# Patient Record
Sex: Male | Born: 1975 | Race: Black or African American | Hispanic: No | Marital: Single | Smoking: Current every day smoker
Health system: Southern US, Community
[De-identification: ages and names within clinical notes are randomized; demographics above are authoritative.]

## PROBLEM LIST (undated history)

## (undated) DIAGNOSIS — I10 Essential (primary) hypertension: Secondary | ICD-10-CM

---

## 2000-07-28 ENCOUNTER — Emergency Department (HOSPITAL_COMMUNITY): Admission: EM | Admit: 2000-07-28 | Discharge: 2000-07-28 | Payer: Self-pay | Admitting: Internal Medicine

## 2000-08-15 ENCOUNTER — Emergency Department (HOSPITAL_COMMUNITY): Admission: EM | Admit: 2000-08-15 | Discharge: 2000-08-15 | Payer: Self-pay | Admitting: Emergency Medicine

## 2001-04-25 ENCOUNTER — Emergency Department (HOSPITAL_COMMUNITY): Admission: EM | Admit: 2001-04-25 | Discharge: 2001-04-25 | Payer: Self-pay | Admitting: Podiatry

## 2002-09-24 ENCOUNTER — Emergency Department (HOSPITAL_COMMUNITY): Admission: EM | Admit: 2002-09-24 | Discharge: 2002-09-24 | Payer: Self-pay | Admitting: Emergency Medicine

## 2002-09-24 ENCOUNTER — Encounter: Payer: Self-pay | Admitting: Emergency Medicine

## 2003-05-26 ENCOUNTER — Emergency Department (HOSPITAL_COMMUNITY): Admission: EM | Admit: 2003-05-26 | Discharge: 2003-05-26 | Payer: Self-pay | Admitting: Emergency Medicine

## 2003-06-21 ENCOUNTER — Emergency Department (HOSPITAL_COMMUNITY): Admission: EM | Admit: 2003-06-21 | Discharge: 2003-06-21 | Payer: Self-pay | Admitting: Emergency Medicine

## 2003-12-29 ENCOUNTER — Emergency Department (HOSPITAL_COMMUNITY): Admission: EM | Admit: 2003-12-29 | Discharge: 2003-12-29 | Payer: Self-pay | Admitting: Emergency Medicine

## 2005-07-10 ENCOUNTER — Emergency Department (HOSPITAL_COMMUNITY): Admission: EM | Admit: 2005-07-10 | Discharge: 2005-07-10 | Payer: Self-pay | Admitting: Family Medicine

## 2005-11-19 ENCOUNTER — Emergency Department (HOSPITAL_COMMUNITY): Admission: EM | Admit: 2005-11-19 | Discharge: 2005-11-19 | Payer: Self-pay | Admitting: Family Medicine

## 2005-12-04 ENCOUNTER — Ambulatory Visit (HOSPITAL_BASED_OUTPATIENT_CLINIC_OR_DEPARTMENT_OTHER): Admission: RE | Admit: 2005-12-04 | Discharge: 2005-12-04 | Payer: Self-pay | Admitting: Ophthalmology

## 2006-04-10 ENCOUNTER — Emergency Department (HOSPITAL_COMMUNITY): Admission: EM | Admit: 2006-04-10 | Discharge: 2006-04-10 | Payer: Self-pay | Admitting: Emergency Medicine

## 2008-02-02 ENCOUNTER — Emergency Department (HOSPITAL_COMMUNITY): Admission: EM | Admit: 2008-02-02 | Discharge: 2008-02-02 | Payer: Self-pay | Admitting: Emergency Medicine

## 2009-06-25 ENCOUNTER — Emergency Department (HOSPITAL_COMMUNITY): Admission: EM | Admit: 2009-06-25 | Discharge: 2009-06-25 | Payer: Self-pay | Admitting: Emergency Medicine

## 2009-12-10 ENCOUNTER — Emergency Department (HOSPITAL_COMMUNITY): Admission: EM | Admit: 2009-12-10 | Discharge: 2009-12-10 | Payer: Self-pay | Admitting: Emergency Medicine

## 2010-04-08 ENCOUNTER — Emergency Department (HOSPITAL_COMMUNITY): Admission: EM | Admit: 2010-04-08 | Discharge: 2010-04-08 | Payer: Self-pay | Admitting: Family Medicine

## 2011-02-01 LAB — POCT URINALYSIS DIP (DEVICE)
Glucose, UA: NEGATIVE mg/dL
Hgb urine dipstick: NEGATIVE
Ketones, ur: NEGATIVE mg/dL
Nitrite: NEGATIVE
Protein, ur: 30 mg/dL — AB
Specific Gravity, Urine: 1.025 (ref 1.005–1.030)
Urobilinogen, UA: 1 mg/dL (ref 0.0–1.0)
pH: 6 (ref 5.0–8.0)

## 2011-02-02 LAB — POCT URINALYSIS DIP (DEVICE)
Bilirubin Urine: NEGATIVE
Glucose, UA: 100 mg/dL — AB
Hgb urine dipstick: NEGATIVE
Ketones, ur: NEGATIVE mg/dL
Nitrite: NEGATIVE
Protein, ur: NEGATIVE mg/dL
Specific Gravity, Urine: >=1.03 (ref 1.005–1.030)
Urobilinogen, UA: 0.2 mg/dL (ref 0.0–1.0)
pH: 6 (ref 5.0–8.0)

## 2011-04-03 NOTE — Op Note (Signed)
NAME:  Riley Gates, Riley Gates NO.:  [id number]   MEDICAL RECORD NO.:  [id number]          PATIENT TYPE:  AMB   LOCATION:  DSC                          FACILITY:  [facility]   PHYSICIAN:  Salley Scarlet., M.D.DATE OF BIRTH:  10-26-1976   DATE OF PROCEDURE:  12/04/2005  DATE OF DISCHARGE:                                 OPERATIVE REPORT   PREOPERATIVE DIAGNOSIS:  Chalazion, lower lid, left eye.   POSTOPERATIVE DIAGNOSIS:  Chalazion, lower lid, left eye.   OPERATION:  Chalazion excision.   ANESTHESIA:  Local using Xylocaine 1%.   PROCEDURE:  The lower lid was inspected and there was a moderate size lesion  located along the medial 1/3 of the lower lid of the left eye.  The patient  was prepped and draped in the usual manner.  Then, several mL of Xylocaine  were injected into the lower lid in the region of the lesion.  The chalazion  clamp was applied over the lesion and the lid was everted.  A cruciate  incision was made in the tarsus of the lesion and the lesion was curetted  using the chalazion curet.  The sac was incised in total using sharp and  blunt dissection.  Polysporin ophthalmic ointment and a pressure patch was  applied.  The patient tolerated the procedure well and was discharged to the  post anesthesia care unit in satisfactory condition.  He is instructed to  rest today, to take Tylenol every 4 hours as needed for pain, to remove the  patch tomorrow, and to resume Vigamox drops four times a day.  He is to see  me in the office in one week.   DISCHARGE DIAGNOSIS:  Chalazion lower lid, left eye.      Salley Scarlet., M.D.  Electronically Signed     TB/MEDQ  D:  12/04/2005  T:  12/04/2005  Job:  [id number]

## 2011-04-20 ENCOUNTER — Inpatient Hospital Stay (INDEPENDENT_AMBULATORY_CARE_PROVIDER_SITE_OTHER)
Admission: RE | Admit: 2011-04-20 | Discharge: 2011-04-20 | Disposition: A | Discharge status: Home / Self Care | Payer: 59 | Source: Ambulatory Visit | Attending: Emergency Medicine | Admitting: Emergency Medicine

## 2011-04-20 DIAGNOSIS — L738 Other specified follicular disorders: Secondary | ICD-10-CM

## 2011-04-23 LAB — CULTURE, ROUTINE-ABSCESS

## 2011-08-10 LAB — OCCULT BLOOD X 1 CARD TO LAB, STOOL: Fecal Occult Bld: POSITIVE

## 2014-03-11 ENCOUNTER — Emergency Department (HOSPITAL_BASED_OUTPATIENT_CLINIC_OR_DEPARTMENT_OTHER): Payer: 59

## 2014-03-11 ENCOUNTER — Emergency Department (HOSPITAL_BASED_OUTPATIENT_CLINIC_OR_DEPARTMENT_OTHER)
Admission: EM | Admit: 2014-03-11 | Discharge: 2014-03-11 | Disposition: A | Discharge status: Home / Self Care | Payer: 59 | Attending: Emergency Medicine | Admitting: Emergency Medicine

## 2014-03-11 ENCOUNTER — Encounter (HOSPITAL_BASED_OUTPATIENT_CLINIC_OR_DEPARTMENT_OTHER): Payer: Self-pay | Admitting: Emergency Medicine

## 2014-03-11 DIAGNOSIS — R0602 Shortness of breath: Secondary | ICD-10-CM | POA: Insufficient documentation

## 2014-03-11 DIAGNOSIS — R55 Syncope and collapse: Secondary | ICD-10-CM | POA: Insufficient documentation

## 2014-03-11 DIAGNOSIS — R42 Dizziness and giddiness: Secondary | ICD-10-CM | POA: Insufficient documentation

## 2014-03-11 DIAGNOSIS — R0789 Other chest pain: Secondary | ICD-10-CM | POA: Insufficient documentation

## 2014-03-11 DIAGNOSIS — F17200 Nicotine dependence, unspecified, uncomplicated: Secondary | ICD-10-CM | POA: Insufficient documentation

## 2014-03-11 DIAGNOSIS — I10 Essential (primary) hypertension: Secondary | ICD-10-CM | POA: Insufficient documentation

## 2014-03-11 DIAGNOSIS — R61 Generalized hyperhidrosis: Secondary | ICD-10-CM | POA: Insufficient documentation

## 2014-03-11 HISTORY — DX: Essential (primary) hypertension: I10

## 2014-03-11 LAB — CBC
HEMATOCRIT: 51 % (ref 39.0–52.0)
HEMOGLOBIN: 16.6 g/dL (ref 13.0–17.0)
MCH: 27.4 pg (ref 26.0–34.0)
MCHC: 32.5 g/dL (ref 30.0–36.0)
MCV: 84.3 fL (ref 78.0–100.0)
Platelets: 320 10*3/uL (ref 150–400)
RBC: 6.05 MIL/uL — AB (ref 4.22–5.81)
RDW: 14.9 % (ref 11.5–15.5)
WBC: 6.8 10*3/uL (ref 4.0–10.5)

## 2014-03-11 LAB — BASIC METABOLIC PANEL
BUN: 9 mg/dL (ref 6–23)
CHLORIDE: 103 meq/L (ref 96–112)
CO2: 27 meq/L (ref 19–32)
CREATININE: 1 mg/dL (ref 0.50–1.35)
Calcium: 9.1 mg/dL (ref 8.4–10.5)
GFR calc Af Amer: >90 mL/min (ref >90)
GFR calc non Af Amer: >90 mL/min (ref >90)
GLUCOSE: 125 mg/dL — AB (ref 70–99)
Potassium: 4.1 mEq/L (ref 3.7–5.3)
Sodium: 142 mEq/L (ref 137–147)

## 2014-03-11 LAB — TROPONIN I: Troponin I: <0.3 ng/mL (ref <0.30)

## 2014-03-11 MED ORDER — HYDROCHLOROTHIAZIDE 25 MG PO TABS: 12.5000 mg | ORAL_TABLET | Freq: Every day | ORAL | Status: DC

## 2014-03-11 MED ORDER — SODIUM CHLORIDE 0.9 % IV BOLUS (SEPSIS)
1000.0000 mL | Freq: Once | INTRAVENOUS | Status: AC
  Administered 2014-03-11: 1000 mL via INTRAVENOUS

## 2014-03-11 NOTE — ED Notes (Addendum)
IV D/C cath in tact, pt tolerated well

## 2014-03-11 NOTE — ED Notes (Addendum)
Patient reports that he has been diagnosed in the past with HTN and never followed up by taking any medication. Yesterday became diaphoretic and near syncope with headache and assumed his BP was high and took his mother's BP pill; norvasc 10mg . Today those symptoms have resolved but wants to get checked out. No chest pain. Patient also reports that he uses cocaine and marijuana 4-5 times per week, last usage Friday night.

## 2014-03-11 NOTE — ED Notes (Addendum)
Pt states has felt "stressed" for past few years and that he has tried self-medicating with drugs.  Last used cocaine on Friday, only smokes cigarettes when using cocaine.  Last alcohol consumed Saturday morning.  Pt c/o nausea and dizziness with blurry vision off and on for past few months.  Has never taken BP medications regularly.   Pt tearful during assessment.  Denies any thoughts of hurting himself or others.

## 2014-03-11 NOTE — Discharge Instructions (Signed)
Arterial Hypertension °Arterial hypertension (high blood pressure) is a condition of elevated pressure in your blood vessels. Hypertension over a long period of time is a risk factor for strokes, heart attacks, and heart failure. It is also the leading cause of kidney (renal) failure.  °CAUSES  °· In Adults -- Over 90% of all hypertension has no known cause. This is called essential or primary hypertension. In the other 10% of people with hypertension, the increase in blood pressure is caused by another disorder. This is called secondary hypertension. Important causes of secondary hypertension are: °· Heavy alcohol use. °· Obstructive sleep apnea. °· Hyperaldosterosim (Conn's syndrome). °· Steroid use. °· Chronic kidney failure. °· Hyperparathyroidism. °· Medications. °· Renal artery stenosis. °· Pheochromocytoma. °· Cushing's disease. °· Coarctation of the aorta. °· Scleroderma renal crisis. °· Licorice (in excessive amounts). °· Drugs (cocaine, methamphetamine). °Your caregiver can explain any items above that apply to you. °· In Children -- Secondary hypertension is more common and should always be considered. °· Pregnancy -- Few women of childbearing age have high blood pressure. However, up to 10% of them develop hypertension of pregnancy. Generally, this will not harm the woman. It may be a sign of 3 complications of pregnancy: preeclampsia, HELLP syndrome, and eclampsia. Follow up and control with medication is necessary. °SYMPTOMS  °· This condition normally does not produce any noticeable symptoms. It is usually found during a routine exam. °· Malignant hypertension is a late problem of high blood pressure. It may have the following symptoms: °· Headaches. °· Blurred vision. °· End-organ damage (this means your kidneys, heart, lungs, and other organs are being damaged). °· Stressful situations can increase the blood pressure. If a person with normal blood pressure has their blood pressure go up while being  seen by their caregiver, this is often termed "white coat hypertension." Its importance is not known. It may be related with eventually developing hypertension or complications of hypertension. °· Hypertension is often confused with mental tension, stress, and anxiety. °DIAGNOSIS  °The diagnosis is made by 3 separate blood pressure measurements. They are taken at least 1 week apart from each other. If there is organ damage from hypertension, the diagnosis may be made without repeat measurements. °Hypertension is usually identified by having blood pressure readings: °· Above 140/90 mmHg measured in both arms, at 3 separate times, over a couple weeks. °· Over 130/80 mmHg should be considered a risk factor and may require treatment in patients with diabetes. °Blood pressure readings over 120/80 mmHg are called "pre-hypertension" even in non-diabetic patients. °To get a true blood pressure measurement, use the following guidelines. Be aware of the factors that can alter blood pressure readings. °· Take measurements at least 1 hour after caffeine. °· Take measurements 30 minutes after smoking and without any stress. This is another reason to quit smoking  it raises your blood pressure. °· Use a proper cuff size. Ask your caregiver if you are not sure about your cuff size. °· Most home blood pressure cuffs are automatic. They will measure systolic and diastolic pressures. The systolic pressure is the pressure reading at the start of sounds. Diastolic pressure is the pressure at which the sounds disappear. If you are elderly, measure pressures in multiple postures. Try sitting, lying or standing. °· Sit at rest for a minimum of 5 minutes before taking measurements. °· You should not be on any medications like decongestants. These are found in many cold medications. °· Record your blood pressure readings and review   them with your caregiver. °If you have hypertension: °· Your caregiver may do tests to be sure you do not have  secondary hypertension (see "causes" above). °· Your caregiver may also look for signs of metabolic syndrome. This is also called Syndrome X or Insulin Resistance Syndrome. You may have this syndrome if you have type 2 diabetes, abdominal obesity, and abnormal blood lipids in addition to hypertension. °· Your caregiver will take your medical and family history and perform a physical exam. °· Diagnostic tests may include blood tests (for glucose, cholesterol, potassium, and kidney function), a urinalysis, or an EKG. Other tests may also be necessary depending on your condition. °PREVENTION  °There are important lifestyle issues that you can adopt to reduce your chance of developing hypertension: °· Maintain a normal weight. °· Limit the amount of salt (sodium) in your diet. °· Exercise often. °· Limit alcohol intake. °· Get enough potassium in your diet. Discuss specific advice with your caregiver. °· Follow a DASH diet (dietary approaches to stop hypertension). This diet is rich in fruits, vegetables, and low-fat dairy products, and avoids certain fats. °PROGNOSIS  °Essential hypertension cannot be cured. Lifestyle changes and medical treatment can lower blood pressure and reduce complications. The prognosis of secondary hypertension depends on the underlying cause. Many people whose hypertension is controlled with medicine or lifestyle changes can live a normal, healthy life.  °RISKS AND COMPLICATIONS  °While high blood pressure alone is not an illness, it often requires treatment due to its short- and long-term effects on many organs. Hypertension increases your risk for: °· CVAs or strokes (cerebrovascular accident). °· Heart failure due to chronically high blood pressure (hypertensive cardiomyopathy). °· Heart attack (myocardial infarction). °· Damage to the retina (hypertensive retinopathy). °· Kidney failure (hypertensive nephropathy). °Your caregiver can explain list items above that apply to you. Treatment  of hypertension can significantly reduce the risk of complications. °TREATMENT  °· For overweight patients, weight loss and regular exercise are recommended. Physical fitness lowers blood pressure. °· Mild hypertension is usually treated with diet and exercise. A diet rich in fruits and vegetables, fat-free dairy products, and foods low in fat and salt (sodium) can help lower blood pressure. Decreasing salt intake decreases blood pressure in a 1/3 of people. °· Stop smoking if you are a smoker. °The steps above are highly effective in reducing blood pressure. While these actions are easy to suggest, they are difficult to achieve. Most patients with moderate or severe hypertension end up requiring medications to bring their blood pressure down to a normal level. There are several classes of medications for treatment. Blood pressure pills (antihypertensives) will lower blood pressure by their different actions. Lowering the blood pressure by 10 mmHg may decrease the risk of complications by as much as 25%. °The goal of treatment is effective blood pressure control. This will reduce your risk for complications. Your caregiver will help you determine the best treatment for you according to your lifestyle. What is excellent treatment for one person, may not be for you. °HOME CARE INSTRUCTIONS  °· Do not smoke. °· Follow the lifestyle changes outlined in the "Prevention" section. °· If you are on medications, follow the directions carefully. Blood pressure medications must be taken as prescribed. Skipping doses reduces their benefit. It also puts you at risk for problems. °· Follow up with your caregiver, as directed. °· If you are asked to monitor your blood pressure at home, follow the guidelines in the "Diagnosis" section above. °SEEK MEDICAL CARE   IF:  °· You think you are having medication side effects. °· You have recurrent headaches or lightheadedness. °· You have swelling in your ankles. °· You have trouble with  your vision. °SEEK IMMEDIATE MEDICAL CARE IF:  °· You have sudden onset of chest pain or pressure, difficulty breathing, or other symptoms of a heart attack. °· You have a severe headache. °· You have symptoms of a stroke (such as sudden weakness, difficulty speaking, difficulty walking). °MAKE SURE YOU:  °· Understand these instructions. °· Will watch your condition. °· Will get help right away if you are not doing well or get worse. °Document Released: 11/02/2005 Document Revised: 01/25/2012 Document Reviewed: 06/02/2007 °[employer]® Patient Information ©2014 [employer]. ° °

## 2014-03-11 NOTE — ED Provider Notes (Signed)
CSN: [id number]     Arrival date & time 03/11/14  1137 History   First MD Initiated Contact with Patient 03/11/14 1204     Chief Complaint  Patient presents with  . Hypertension     (Consider location/radiation/quality/duration/timing/severity/associated sxs/prior Treatment) Patient is a 38 y.o. male presenting with hypertension. The history is provided by the patient. No language interpreter was used.  Hypertension Associated symptoms include diaphoresis. Pertinent negatives include no chills, fever or vomiting. Associated symptoms comments: He reports being told more than one year ago that he had treatable hypertension but he never went back for treatment. Yesterday, he had an episode of chest tightness, lightheadedness, diaphoresis and near syncope that lasted approximately 15 minutes. He has had no further symptoms and is prompted to come in today for evaluation by a family member. He is a smoker. His parents have hypertension but he denies CAD in any first degree family member. .    Past Medical History  Diagnosis Date  . Hypertension    History reviewed. No pertinent past surgical history. History reviewed. No pertinent family history. History  Substance Use Topics  . Smoking status: Current Every Day Smoker -- 0.50 packs/day    Types: Cigarettes  . Smokeless tobacco: Not on file  . Alcohol Use: Not on file    Review of Systems  Constitutional: Positive for diaphoresis. Negative for fever and chills.  HENT: Negative.   Respiratory: Positive for chest tightness and shortness of breath.   Cardiovascular: Negative.  Negative for leg swelling.  Gastrointestinal: Negative.  Negative for vomiting.  Musculoskeletal: Negative.   Skin: Negative.   Neurological: Positive for light-headedness.       See HPI.      Allergies  Review of patient's allergies indicates no known allergies.  Home Medications   Prior to Admission medications   Not on File   BP 146/91  Pulse 88   Temp(Src) 98 F (36.7 C) (Oral)  Resp 16  SpO2 99% Physical Exam  Constitutional: He is oriented to person, place, and time. He appears well-developed and well-nourished.  HENT:  Head: Normocephalic.  Neck: Normal range of motion. Neck supple.  Cardiovascular: Normal rate and regular rhythm.   Pulmonary/Chest: Effort normal and breath sounds normal.  Abdominal: Soft. Bowel sounds are normal. There is no tenderness. There is no rebound and no guarding.  Musculoskeletal: Normal range of motion. He exhibits no edema.  Neurological: He is alert and oriented to person, place, and time.  Skin: Skin is warm and dry. No rash noted.  Psychiatric: He has a normal mood and affect.    ED Course  Procedures (including critical care time) Labs Review Labs Reviewed  CBC - Abnormal; Notable for the following:    RBC 6.05 (*)    All other components within normal limits  BASIC METABOLIC PANEL - Abnormal; Notable for the following:    Glucose, Bld 125 (*)    All other components within normal limits  TROPONIN I   Results for orders placed during the hospital encounter of 03/11/14  TROPONIN I      Result Value Ref Range   Troponin I <0.30  <0.30 ng/mL  CBC      Result Value Ref Range   WBC 6.8  4.0 - 10.5 K/uL   RBC 6.05 (*) 4.22 - 5.81 MIL/uL   Hemoglobin 16.6  13.0 - 17.0 g/dL   HCT 91.451.0  78.239.0 - 95.652.0 %   MCV 84.3  78.0 - 100.0 fL  MCH 27.4  26.0 - 34.0 pg   MCHC 32.5  30.0 - 36.0 g/dL   RDW 09.814.9  11.911.5 - 14.715.5 %   Platelets 320  150 - 400 K/uL  BASIC METABOLIC PANEL      Result Value Ref Range   Sodium 142  137 - 147 mEq/L   Potassium 4.1  3.7 - 5.3 mEq/L   Chloride 103  96 - 112 mEq/L   CO2 27  19 - 32 mEq/L   Glucose, Bld 125 (*) 70 - 99 mg/dL   BUN 9  6 - 23 mg/dL   Creatinine, Ser 8.291.00  0.50 - 1.35 mg/dL   Calcium 9.1  8.4 - 56.210.5 mg/dL   GFR calc non Af Amer >90  >90 mL/min   GFR calc Af Amer >90  >90 mL/min    Imaging Review Dg Chest 2 View  03/11/2014   CLINICAL DATA:   Diaphoretic, near syncopal  EXAM: CHEST  2 VIEW  COMPARISON:  Prior chest x-ray 06/25/2009  FINDINGS: The lungs are clear and negative for focal airspace consolidation, pulmonary edema or suspicious pulmonary nodule. No pleural effusion or pneumothorax. Cardiac and mediastinal contours are within normal limits. No acute fracture or lytic or blastic osseous lesions. The visualized upper abdominal bowel gas pattern is unremarkable.  IMPRESSION: No active cardiopulmonary disease.   Electronically Signed   By: Malachy MoanHeath  McCullough M.D.   On: 03/11/2014 13:19     EKG Interpretation   Date/Time:  Sunday March 11 2014 11:55:06 EDT Ventricular Rate:  96 PR Interval:  164 QRS Duration: 94 QT Interval:  372 QTC Calculation: 469 R Axis:   49 Text Interpretation:  Normal sinus rhythm Slow R wave progression. No ST  changes. Abnormal ECG No comparison EKG. Confirmed by Fayrene FearingJAMES  MD, MARK  [phone]([id number]) on 03/11/2014 1:41:52 PM      MDM   Final diagnoses:  None    1. Hypertension  Blood pressure minimally elevated in ED. Labs, chest x-ray unremarkable for acute finding. Patient started on low dose HCTZ and referred for outpatient management of blood pressure. He is currently asymptomatic and has been without symptoms of chest pain or lightheadedness while in ED. Stable for discharge.     Arnoldo HookerShari A Rahel Carlton, PA-C 03/11/14 1430

## 2014-03-19 NOTE — ED Provider Notes (Signed)
Medical screening examination/treatment/procedure(s) were performed by non-physician practitioner and as supervising physician I was immediately available for consultation/collaboration.   EKG Interpretation   Date/Time:  Sunday March 11 2014 11:55:06 EDT Ventricular Rate:  96 PR Interval:  164 QRS Duration: 94 QT Interval:  372 QTC Calculation: 469 R Axis:   49 Text Interpretation:  Normal sinus rhythm Slow R wave progression. No ST  changes. Abnormal ECG No comparison EKG. Confirmed by Fayrene FearingJAMES  MD, Anthony Roland  [phone]([id number]) on 03/11/2014 1:41:52 PM        Rolland PorterMark Hokulani Rogel, MD 03/19/14 2204

## 2014-06-20 ENCOUNTER — Emergency Department (HOSPITAL_COMMUNITY)
Admission: EM | Admit: 2014-06-20 | Discharge: 2014-06-20 | Disposition: A | Discharge status: Home / Self Care | Payer: 59 | Source: Home / Self Care | Attending: Emergency Medicine | Admitting: Emergency Medicine

## 2014-06-20 ENCOUNTER — Other Ambulatory Visit (HOSPITAL_COMMUNITY)
Admission: RE | Admit: 2014-06-20 | Discharge: 2014-06-20 | Disposition: A | Discharge status: Home / Self Care | Payer: 59 | Source: Ambulatory Visit | Attending: Emergency Medicine | Admitting: Emergency Medicine

## 2014-06-20 ENCOUNTER — Encounter (HOSPITAL_COMMUNITY): Payer: Self-pay | Admitting: Emergency Medicine

## 2014-06-20 DIAGNOSIS — Z113 Encounter for screening for infections with a predominantly sexual mode of transmission: Secondary | ICD-10-CM | POA: Insufficient documentation

## 2014-06-20 DIAGNOSIS — N489 Disorder of penis, unspecified: Secondary | ICD-10-CM

## 2014-06-20 DIAGNOSIS — N4889 Other specified disorders of penis: Secondary | ICD-10-CM

## 2014-06-20 NOTE — ED Notes (Signed)
Reports sores/lesion on tip of penis x 2 1/2 to 3 wks.   Soreness is worse after intercourse.  Pt has used Vaseline and hydrocortisone with no relief.   Denies penile discharge/pelvic/abdominal pain.    Pt has a hx of hypertension and has been out of medication for about 3 months.

## 2014-06-20 NOTE — ED Provider Notes (Signed)
CSN: [id number]     Arrival date & time 06/20/14  1958 History   First MD Initiated Contact with Patient 06/20/14 2022     Chief Complaint  Patient presents with  . Penis Pain    lesions   (Consider location/radiation/quality/duration/timing/severity/associated sxs/prior Treatment) He is a 38 year old male here today for evaluation of penis lesions. He states they've been present for about 3 weeks. He's been applying hydrocortisone and Vaseline to them. He states he seemed to improve, but then he engages in sex and a flare up again. They are painful. He states he had similar lesions about 2 years ago. He denies any penile discharge. No groin adenopathy. No abdominal pain. No testicular pain. No fevers or chills. He has had multiple sexual partners in the last few weeks. He has intermittently used condoms.  Patient is a 38 y.o. male presenting with penile pain.  Penis Pain    Past Medical History  Diagnosis Date  . Hypertension    History reviewed. No pertinent past surgical history. History reviewed. No pertinent family history. History  Substance Use Topics  . Smoking status: Current Every Day Smoker -- 0.50 packs/day    Types: Cigarettes  . Smokeless tobacco: Not on file  . Alcohol Use: Yes    Review of Systems  Constitutional: Negative for fever and chills.  Genitourinary: Negative for dysuria, discharge, scrotal swelling, penile pain and testicular pain.       Lesion on penis  Hematological: Negative for adenopathy.    Allergies  Review of patient's allergies indicates no known allergies.  Home Medications   Prior to Admission medications   Medication Sig Start Date End Date Taking? Authorizing Provider  hydrochlorothiazide (HYDRODIURIL) 25 MG tablet Take 0.5 tablets (12.5 mg total) by mouth daily. 03/11/14   Shari A Upstill, PA-C   BP 154/93  Pulse 93  Temp(Src) 98 F (36.7 C) (Oral)  SpO2 98% Physical Exam  Constitutional: He is oriented to person, place, and  time. He appears well-developed and well-nourished. No distress.  Genitourinary: Testes normal. Right testis shows no mass and no tenderness. Left testis shows no mass and no tenderness. Circumcised. No discharge found.  He has 4 small erythematous papules on the right side of the shaft of the penis just proximal to the glans.  Lymphadenopathy:       Right: No inguinal adenopathy present.       Left: No inguinal adenopathy present.  Neurological: He is alert and oriented to person, place, and time.    ED Course  Procedures (including critical care time) Labs Review Labs Reviewed  HIV ANTIBODY (ROUTINE TESTING)  RPR  HSV 1 ANTIBODY, IGG  HSV 2 ANTIBODY, IGG  URINE CYTOLOGY ANCILLARY ONLY    Imaging Review No results found.   MDM   1. Penile lesion    I suspect genital herpes given history and appearance of lesions. Recommended that he stop using hydrocortisone cream. Okay to continue Vaseline. Checking urine for gonorrhea and Chlamydia. Blood work for HIV, RPR, HSV. Recommended no sex for 2 weeks. Recommended condom use every time. We will call if anything comes back positive.    Charm RingsErin J Danetta Prom, MD 06/20/14 [phone]

## 2014-06-20 NOTE — Discharge Instructions (Signed)
We are testing you for STDs. We will call if anything comes back positive.  Apply vaseline to the spots.  STOP using the hydrocortisone. No sex for 2 weeks. Use condoms every single time.  Follow up if things are getting worse or you develop fevers.

## 2014-06-21 LAB — HIV ANTIBODY (ROUTINE TESTING W REFLEX): HIV 1&2 Ab, 4th Generation: NONREACTIVE

## 2014-06-21 LAB — RPR

## 2014-06-22 ENCOUNTER — Telehealth (HOSPITAL_COMMUNITY): Payer: Self-pay | Admitting: *Deleted

## 2014-06-22 LAB — HSV 1 ANTIBODY, IGG: HSV 1 GLYCOPROTEIN G AB, IGG: 0.16 IV

## 2014-06-22 LAB — HSV 2 ANTIBODY, IGG

## 2014-06-22 NOTE — ED Notes (Signed)
Pt. called for his lab results.  (GC/Chlamydia neg., HIV/RPR non-reactive, HSV 1 0.16 and HSV 2 <0.10, both neg.) Pt. verified x 2 and given neg. results. Pt. instructed to practice safe sex. Vassie MoselleYork, Lamount Bankson M 06/22/2014

## 2015-02-16 ENCOUNTER — Encounter (HOSPITAL_COMMUNITY): Payer: Self-pay

## 2015-02-16 ENCOUNTER — Emergency Department (HOSPITAL_COMMUNITY)
Admission: EM | Admit: 2015-02-16 | Discharge: 2015-02-16 | Disposition: A | Discharge status: Home / Self Care | Attending: Emergency Medicine | Admitting: Emergency Medicine

## 2015-02-16 DIAGNOSIS — H65192 Other acute nonsuppurative otitis media, left ear: Secondary | ICD-10-CM | POA: Diagnosis not present

## 2015-02-16 DIAGNOSIS — Z79899 Other long term (current) drug therapy: Secondary | ICD-10-CM | POA: Insufficient documentation

## 2015-02-16 DIAGNOSIS — H6122 Impacted cerumen, left ear: Secondary | ICD-10-CM | POA: Diagnosis not present

## 2015-02-16 DIAGNOSIS — I10 Essential (primary) hypertension: Secondary | ICD-10-CM | POA: Insufficient documentation

## 2015-02-16 DIAGNOSIS — H9202 Otalgia, left ear: Secondary | ICD-10-CM | POA: Diagnosis present

## 2015-02-16 DIAGNOSIS — Z720 Tobacco use: Secondary | ICD-10-CM | POA: Diagnosis not present

## 2015-02-16 MED ORDER — AMOXICILLIN 250 MG PO CAPS
500.0000 mg | ORAL_CAPSULE | Freq: Once | ORAL | Status: AC
  Administered 2015-02-16: 500 mg via ORAL
  Filled 2015-02-16: qty 2

## 2015-02-16 MED ORDER — AMOXICILLIN 500 MG PO CAPS: 500.0000 mg | ORAL_CAPSULE | Freq: Three times a day (TID) | ORAL | Status: DC

## 2015-02-16 MED ORDER — IBUPROFEN 800 MG PO TABS: 800.0000 mg | ORAL_TABLET | Freq: Three times a day (TID) | ORAL | Status: DC

## 2015-02-16 MED ORDER — IBUPROFEN 800 MG PO TABS
800.0000 mg | ORAL_TABLET | Freq: Once | ORAL | Status: AC
  Administered 2015-02-16: 800 mg via ORAL
  Filled 2015-02-16: qty 1

## 2015-02-16 NOTE — ED Provider Notes (Signed)
CSN: [id number]     Arrival date & time 02/16/15  1707 History   None    Chief Complaint  Patient presents with  . Otalgia     (Consider location/radiation/quality/duration/timing/severity/associated sxs/prior Treatment) Patient is a 39 y.o. male presenting with ear pain. The history is provided by the patient.  Otalgia Location:  Left Behind ear:  No abnormality Quality:  Aching Severity:  Moderate Duration:  8 hours Timing:  Intermittent Progression:  Worsening Chronicity:  Recurrent Context: not direct blow, not foreign body in ear and not loud noise   Relieved by:  Nothing Worsened by:  Nothing tried Ineffective treatments:  None tried Associated symptoms: fever   Associated symptoms: no abdominal pain, no cough and no neck pain   Risk factors: no recent travel     Past Medical History  Diagnosis Date  . Hypertension    History reviewed. No pertinent past surgical history. History reviewed. No pertinent family history. History  Substance Use Topics  . Smoking status: Current Every Day Smoker -- 0.50 packs/day    Types: Cigarettes  . Smokeless tobacco: Not on file  . Alcohol Use: Yes    Review of Systems  Constitutional: Positive for fever. Negative for activity change.       All ROS Neg except as noted in HPI  HENT: Positive for ear pain. Negative for nosebleeds.   Eyes: Negative for photophobia and discharge.  Respiratory: Negative for cough, shortness of breath and wheezing.   Cardiovascular: Negative for chest pain and palpitations.  Gastrointestinal: Negative for abdominal pain and blood in stool.  Genitourinary: Negative for dysuria, frequency and hematuria.  Musculoskeletal: Negative for back pain, arthralgias and neck pain.  Skin: Negative.   Neurological: Negative for dizziness, seizures and speech difficulty.  Psychiatric/Behavioral: Negative for hallucinations and confusion.      Allergies  Review of patient's allergies indicates no known  allergies.  Home Medications   Prior to Admission medications   Medication Sig Start Date End Date Taking? Authorizing Provider  hydrochlorothiazide (HYDRODIURIL) 25 MG tablet Take 0.5 tablets (12.5 mg total) by mouth daily. 03/11/14   Shari Upstill, PA-C   BP 165/96 mmHg  Pulse 95  Temp(Src) 101.1 F (38.4 C) (Oral)  Resp 16  Ht  (1.778 m)  Wt 240 lb (108.863 kg)  BMI 34.44 kg/m2  SpO2 100% Physical Exam  Constitutional: He is oriented to person, place, and time. He appears well-developed and well-nourished.  Non-toxic appearance.  HENT:  Head: Normocephalic.  Right Ear: Tympanic membrane and external ear normal.  Left Ear: Tympanic membrane and external ear normal. No drainage. No foreign bodies. No mastoid tenderness. Decreased hearing is noted.  Cerumen impaction left ear.  Right TM, pt has hole in the the TM, not new.  Eyes: EOM and lids are normal. Pupils are equal, round, and reactive to light.  Neck: Normal range of motion. Neck supple. Carotid bruit is not present.  Cardiovascular: Normal rate, regular rhythm, normal heart sounds, intact distal pulses and normal pulses.   Pulmonary/Chest: Breath sounds normal. No respiratory distress.  Abdominal: Soft. Bowel sounds are normal. There is no tenderness. There is no guarding.  Musculoskeletal: Normal range of motion.  Lymphadenopathy:       Head (right side): No submandibular adenopathy present.       Head (left side): No submandibular adenopathy present.    He has no cervical adenopathy.  Neurological: He is alert and oriented to person, place, and time. He has normal  strength. No cranial nerve deficit or sensory deficit.  Skin: Skin is warm and dry.  Psychiatric: He has a normal mood and affect. His speech is normal.  Nursing note and vitals reviewed.   ED Course  Procedures (including critical care time) Labs Review Labs Reviewed - No data to display  Imaging Review No results found.   EKG  Interpretation None      MDM  Left ear irrigated by nursing. Mod increase redness of the left TM. No FB or swelling of the EAC. Rx for amoxil given to the patient.   Final diagnoses:  None    *I have reviewed nursing notes, vital signs, and all appropriate lab and imaging results for this patient.**    Ivery QualeHobson Trena Dunavan, PA-C 02/16/15 1806  Ivery QualeHobson Zeidy Tayag, PA-C 02/16/15 1809  Mancel BaleElliott Wentz, MD 02/17/15 1115

## 2015-02-16 NOTE — ED Notes (Signed)
Patient with no complaints at this time. Respirations even and unlabored. Skin warm/dry. Discharge instructions reviewed with patient at this time. Patient given opportunity to voice concerns/ask questions. Patient discharged at this time and left Emergency Department with steady gait.   

## 2015-02-16 NOTE — Discharge Instructions (Signed)
A cerumen (wax) impaction was removed from the left ear. Your have some increase redness and mild bulging of the ear drum. Please use amoxil and ibuprofen three times daily with food. Otitis Media Otitis media is redness, soreness, and puffiness (swelling) in the space just behind your eardrum (middle ear). It may be caused by allergies or infection. It often happens along with a cold. HOME CARE  Take your medicine as told. Finish it even if you start to feel better.  Only take over-the-counter or prescription medicines for pain, discomfort, or fever as told by your doctor.  Follow up with your doctor as told. GET HELP IF:  You have otitis media only in one ear, or bleeding from your nose, or both.  You notice a lump on your neck.  You are not getting better in 3-5 days.  You feel worse instead of better. GET HELP RIGHT AWAY IF:   You have pain that is not helped with medicine.  You have puffiness, redness, or pain around your ear.  You get a stiff neck.  You cannot move part of your face (paralysis).  You notice that the bone behind your ear hurts when you touch it. MAKE SURE YOU:   Understand these instructions.  Will watch your condition.  Will get help right away if you are not doing well or get worse. Document Released: 04/20/2008 Document Revised: 11/07/2013 Document Reviewed: 05/30/2013 [facility] Patient Information 2015 [employer] [place]. This information is not intended to replace advice given to you by your health care provider. Make sure you discuss any questions you have with your health care provider.

## 2015-02-16 NOTE — ED Notes (Signed)
Patient c/o left ear ache that started this morning. Denies any drainage but reports difficulty hearing from ear. Patient also reports fever and sinus pressure. Per patient has not had any medication for pain or fever.

## 2016-03-28 IMAGING — CR DG CHEST 2V
2 series · 2 of 2 positions shown · non-contrast
Comparison: Prior chest x-ray 06/25/2009

CLINICAL DATA: Diaphoretic, near syncopal

EXAM:
CHEST  2 VIEW

[w chest pa]
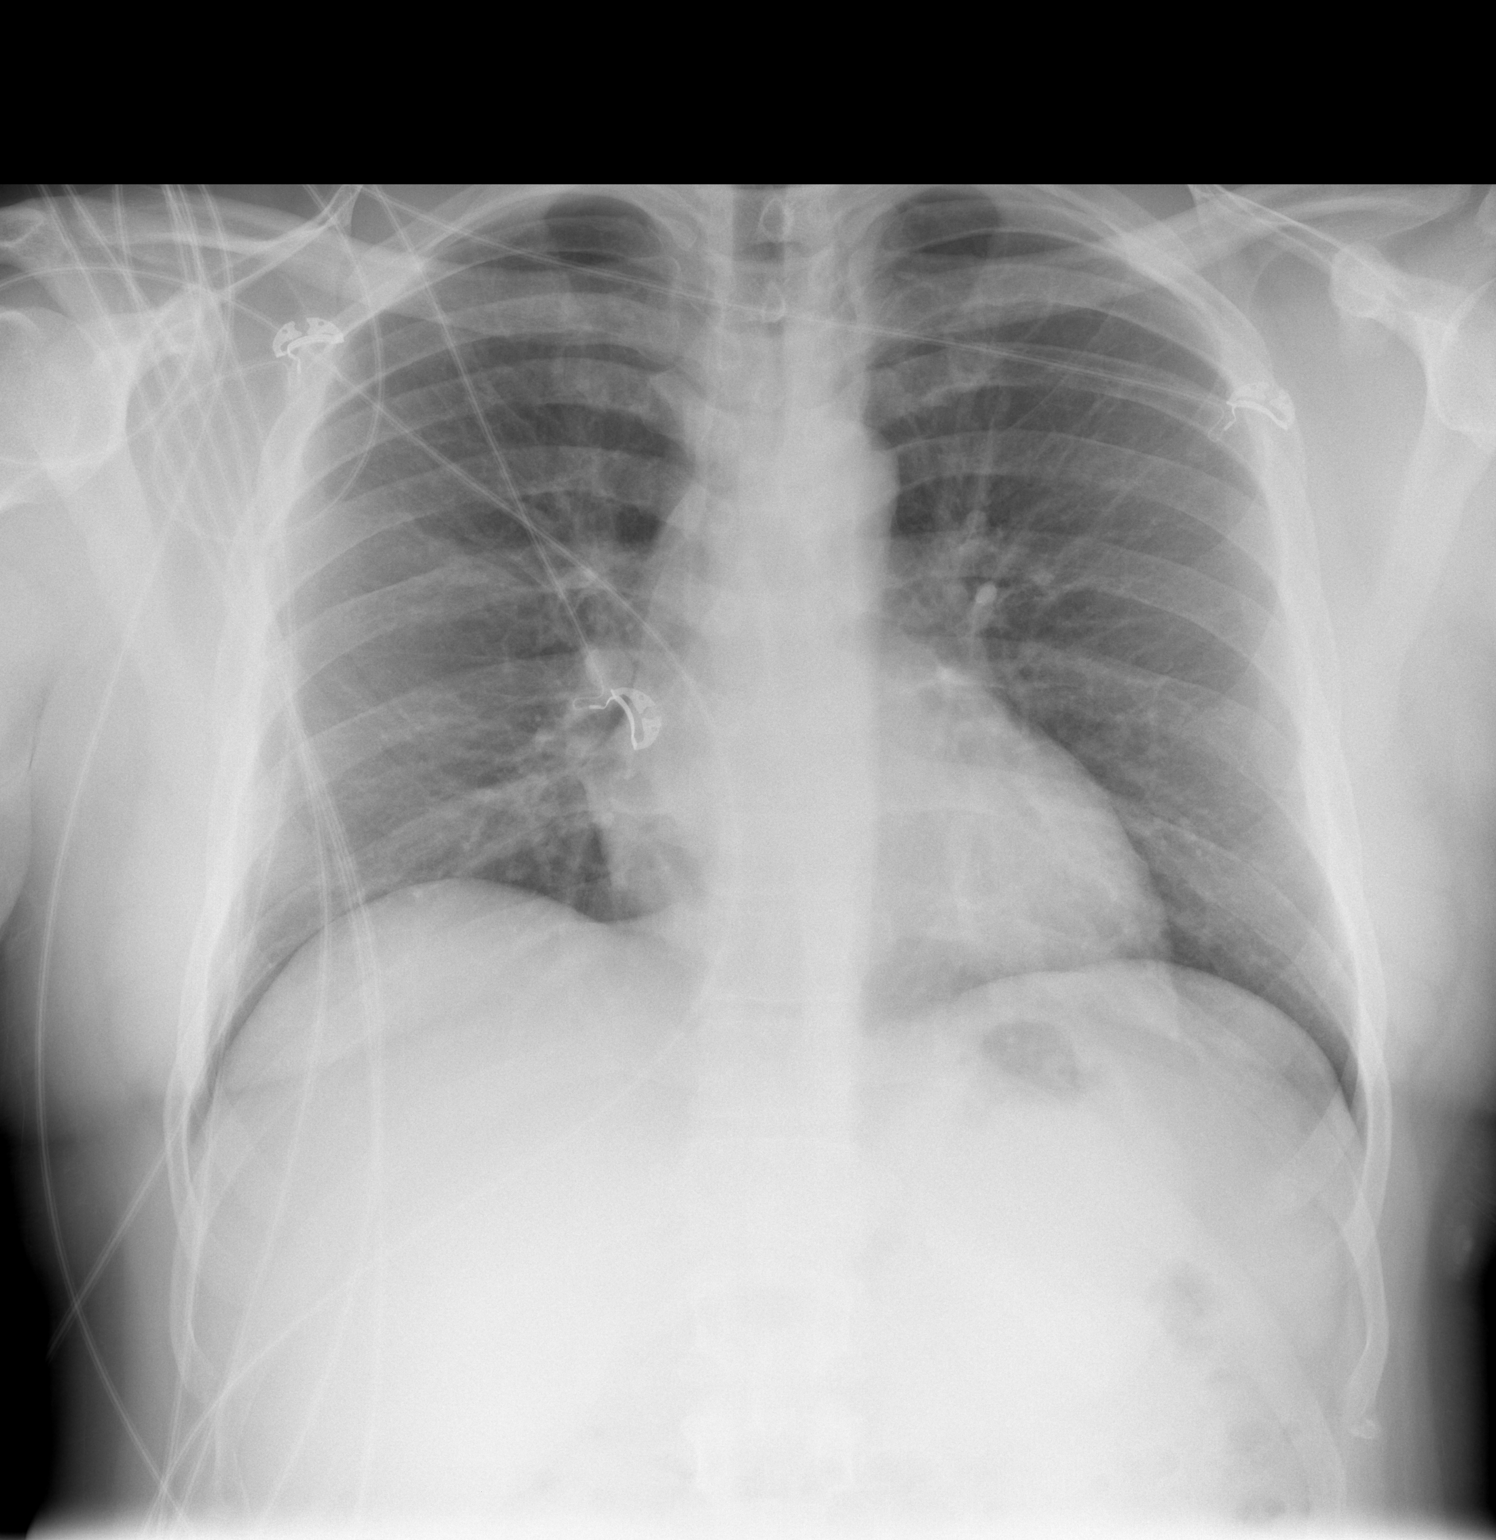

[w chest lat]
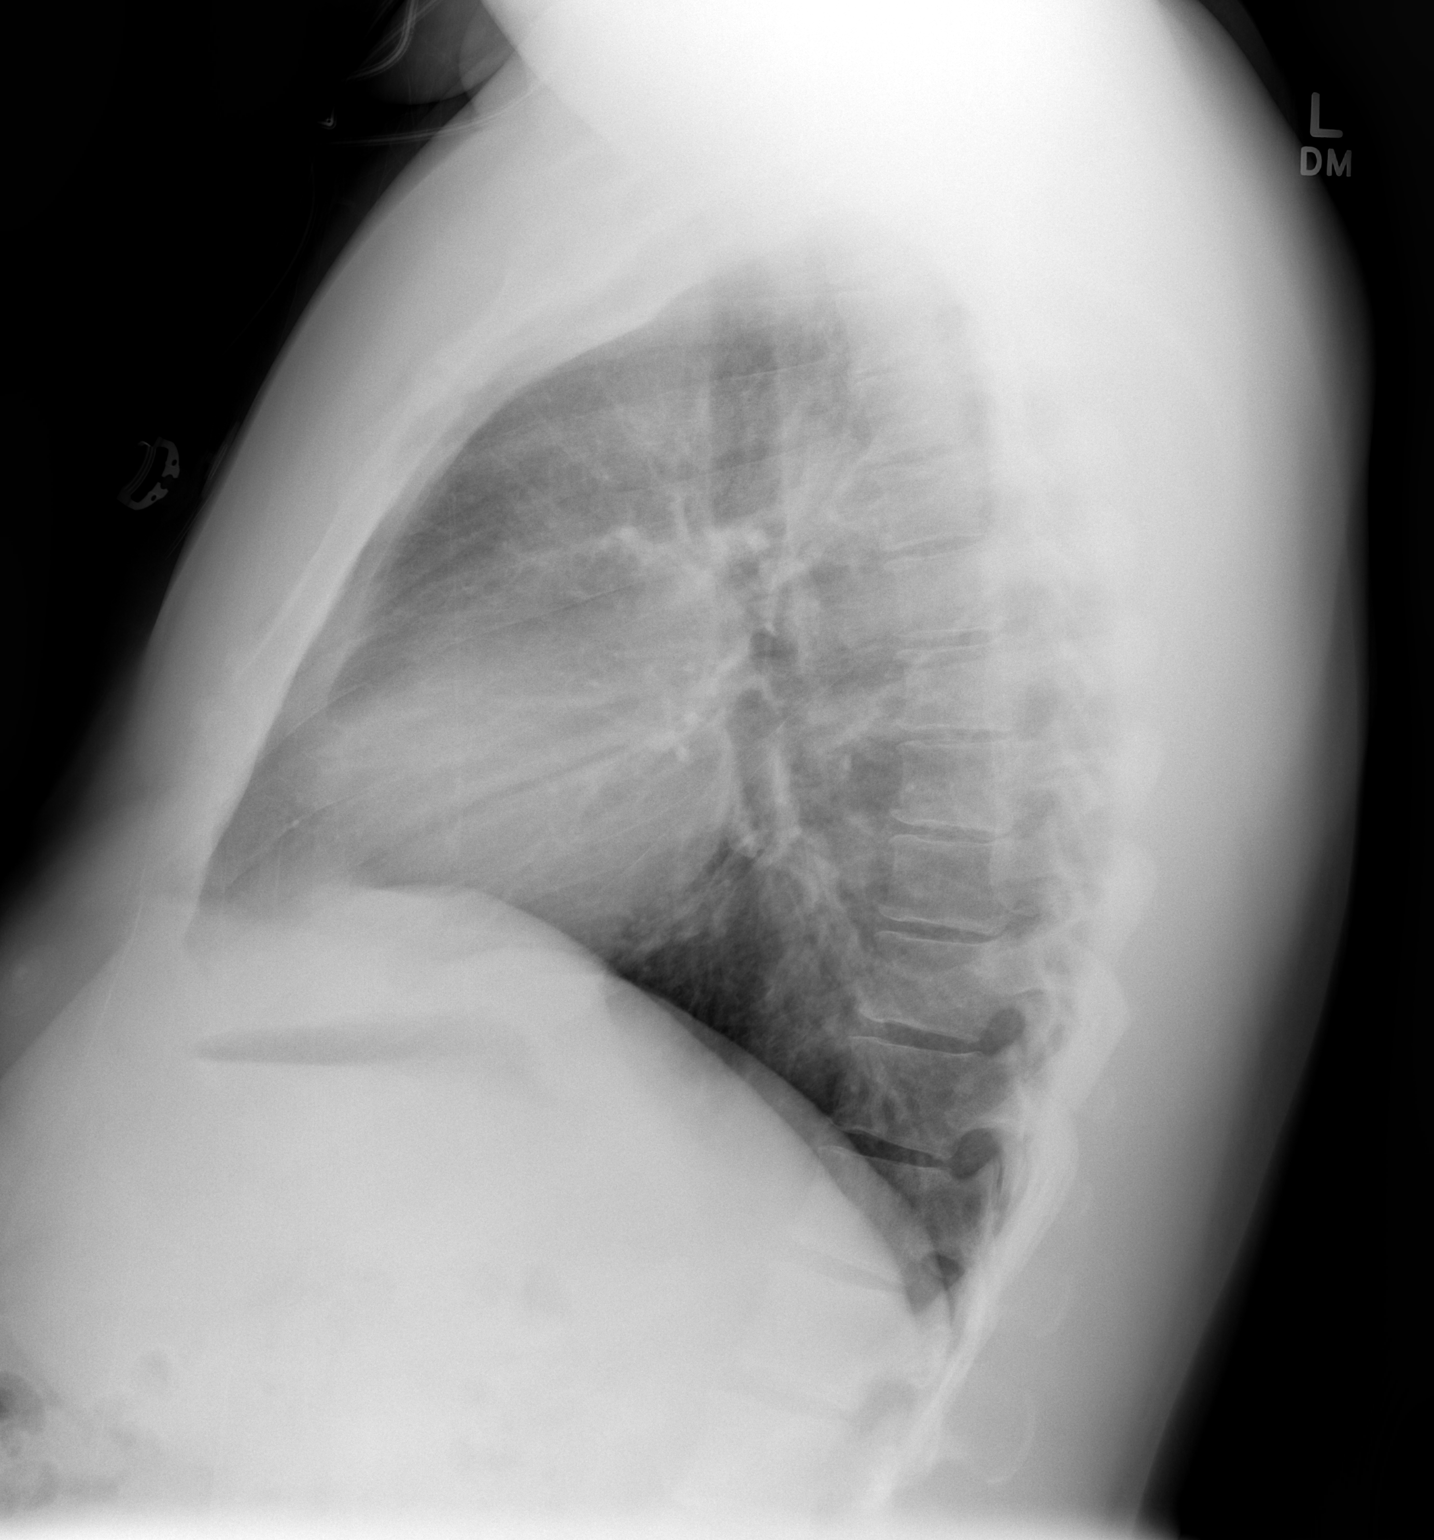

[2 of 2 positions shown; findings below may reference images not displayed]

FINDINGS: The lungs are clear and negative for focal airspace consolidation,
pulmonary edema or suspicious pulmonary nodule. No pleural effusion
or pneumothorax. Cardiac and mediastinal contours are within normal
limits. No acute fracture or lytic or blastic osseous lesions. The
visualized upper abdominal bowel gas pattern is unremarkable.
IMPRESSION: No active cardiopulmonary disease.

## 2024-01-03 ENCOUNTER — Emergency Department (HOSPITAL_COMMUNITY): Payer: 59

## 2024-01-03 ENCOUNTER — Encounter (HOSPITAL_COMMUNITY): Payer: Self-pay

## 2024-01-03 ENCOUNTER — Other Ambulatory Visit: Payer: Self-pay

## 2024-01-03 ENCOUNTER — Emergency Department (HOSPITAL_COMMUNITY)
Admission: EM | Admit: 2024-01-03 | Discharge: 2024-01-04 | Disposition: A | Discharge status: Home / Self Care | Payer: 59 | Attending: Emergency Medicine | Admitting: Emergency Medicine

## 2024-01-03 DIAGNOSIS — W133XXA Fall through floor, initial encounter: Secondary | ICD-10-CM | POA: Diagnosis not present

## 2024-01-03 DIAGNOSIS — S161XXA Strain of muscle, fascia and tendon at neck level, initial encounter: Secondary | ICD-10-CM

## 2024-01-03 DIAGNOSIS — Z23 Encounter for immunization: Secondary | ICD-10-CM | POA: Diagnosis not present

## 2024-01-03 DIAGNOSIS — S060X0A Concussion without loss of consciousness, initial encounter: Secondary | ICD-10-CM | POA: Diagnosis not present

## 2024-01-03 DIAGNOSIS — Z7901 Long term (current) use of anticoagulants: Secondary | ICD-10-CM | POA: Diagnosis not present

## 2024-01-03 DIAGNOSIS — S0993XA Unspecified injury of face, initial encounter: Secondary | ICD-10-CM | POA: Diagnosis present

## 2024-01-03 DIAGNOSIS — I10 Essential (primary) hypertension: Secondary | ICD-10-CM | POA: Diagnosis not present

## 2024-01-03 DIAGNOSIS — S0181XA Laceration without foreign body of other part of head, initial encounter: Secondary | ICD-10-CM

## 2024-01-03 LAB — BASIC METABOLIC PANEL
Anion gap: 11 (ref 5–15)
BUN: 12 mg/dL (ref 6–20)
CO2: 23 mmol/L (ref 22–32)
Calcium: 8.8 mg/dL — ABNORMAL LOW (ref 8.9–10.3)
Chloride: 102 mmol/L (ref 98–111)
Creatinine, Ser: 1.47 mg/dL — ABNORMAL HIGH (ref 0.61–1.24)
GFR, Estimated: 60 mL/min — ABNORMAL LOW (ref >60)
Glucose, Bld: 170 mg/dL — ABNORMAL HIGH (ref 70–99)
Potassium: 3.4 mmol/L — ABNORMAL LOW (ref 3.5–5.1)
Sodium: 136 mmol/L (ref 135–145)

## 2024-01-03 LAB — CBC WITH DIFFERENTIAL/PLATELET
Abs Immature Granulocytes: 0.01 10*3/uL (ref 0.00–0.07)
Basophils Absolute: 0 10*3/uL (ref 0.0–0.1)
Basophils Relative: 0 %
Eosinophils Absolute: 0.1 10*3/uL (ref 0.0–0.5)
Eosinophils Relative: 2 %
HCT: 50.5 % (ref 39.0–52.0)
Hemoglobin: 15.7 g/dL (ref 13.0–17.0)
Immature Granulocytes: 0 %
Lymphocytes Relative: 44 %
Lymphs Abs: 2.6 10*3/uL (ref 0.7–4.0)
MCH: 25.2 pg — ABNORMAL LOW (ref 26.0–34.0)
MCHC: 31.1 g/dL (ref 30.0–36.0)
MCV: 80.9 fL (ref 80.0–100.0)
Monocytes Absolute: 0.6 10*3/uL (ref 0.1–1.0)
Monocytes Relative: 9 %
Neutro Abs: 2.7 10*3/uL (ref 1.7–7.7)
Neutrophils Relative %: 45 %
Platelets: 317 10*3/uL (ref 150–400)
RBC: 6.24 MIL/uL — ABNORMAL HIGH (ref 4.22–5.81)
RDW: 16.5 % — ABNORMAL HIGH (ref 11.5–15.5)
WBC: 6 10*3/uL (ref 4.0–10.5)
nRBC: 0 % (ref 0.0–0.2)

## 2024-01-03 MED ORDER — LIDOCAINE-EPINEPHRINE-TETRACAINE (LET) TOPICAL GEL
3.0000 mL | Freq: Once | TOPICAL | Status: AC
  Administered 2024-01-03: 3 mL via TOPICAL
  Filled 2024-01-03: qty 3

## 2024-01-03 MED ORDER — TETANUS-DIPHTH-ACELL PERTUSSIS 5-2.5-18.5 LF-MCG/0.5 IM SUSY
0.5000 mL | PREFILLED_SYRINGE | Freq: Once | INTRAMUSCULAR | Status: AC
  Administered 2024-01-03: 0.5 mL via INTRAMUSCULAR
  Filled 2024-01-03: qty 0.5

## 2024-01-03 MED ORDER — HYDROCODONE-ACETAMINOPHEN 5-325 MG PO TABS
1.0000 | ORAL_TABLET | Freq: Once | ORAL | Status: AC
  Administered 2024-01-03: 1 via ORAL
  Filled 2024-01-03: qty 1

## 2024-01-03 MED ORDER — LIDOCAINE HCL (PF) 1 % IJ SOLN
30.0000 mL | Freq: Once | INTRAMUSCULAR | Status: AC
  Administered 2024-01-03: 30 mL via INTRADERMAL
  Filled 2024-01-03: qty 30

## 2024-01-03 NOTE — ED Notes (Signed)
Cervical collar applied to patient

## 2024-01-03 NOTE — ED Provider Notes (Signed)
 [place] EMERGENCY DEPARTMENT AT [facility] Provider Note   CSN: [id number] Arrival date & time: 01/03/24  2213     History {Add pertinent medical, surgical, social history, OB history to HPI:1} Chief Complaint  Patient presents with   Facial Laceration    Riley Gates is a 48 y.o. male.  The history is provided by the patient.  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  Patient reports he was sitting in his garage eating an edible and drinking shots of liquor when he felt tipsy and fell forward slamming his face on the concrete floor.  Denies any loss of consciousness.  Has a headache, facial pain and laceration to his face.  He is now reporting neck pain.  No chest or abdominal pain.  No back pain.  No extremity pain. Denies any other acute complaints.  He was otherwise at his baseline prior to the fall    Past Medical History:  Diagnosis Date   Hypertension     Home Medications Prior to Admission medications   Not on File      Allergies    Patient has no known allergies.    Review of Systems   Review of Systems  Eyes:  Negative for visual disturbance.  Skin:  Negative for wound.  Neurological:  Positive for headaches.    Physical Exam Updated Vital Signs BP (!) 134/96 (BP Location: Left Arm)   Pulse 95   Temp 98.1 F (36.7 C)   Resp 16   Ht 1.753 m (5\' 9" )   Wt 108.9 kg   SpO2 100%   BMI 35.44 kg/m  Physical Exam CONSTITUTIONAL: Mildly disheveled and appears intoxicated HEAD: Abrasion and hematoma to forehead EYES: EOMI/PERRL, no ocular trauma noted ENMT: Mucous membranes moist Large laceration noted between the nose and upper lip.  No active bleeding, it is not through and through.  There is no septal hematoma. Midface stable, dentition intact. See photo SPINE/BACK cervical spine tenderness noted, no thoracic or lumbar tenderness, no  bruising/crepitance/stepoffs noted to spine CV: S1/S2 noted, no murmurs/rubs/gallops noted LUNGS: Lungs are clear to auscultation bilaterally, no apparent distress ABDOMEN: soft, nontender NEURO: Pt is awake/alert/appropriate, moves all extremitiesx4.  No facial droop.  Moves all extremities x 4 EXTREMITIES: pulses normal/equal, full ROM, no deformities SKIN: warm, color normal   ED Results / Procedures / Treatments   Labs (all labs ordered are listed, but only abnormal results are displayed) Labs Reviewed  CBC WITH DIFFERENTIAL/PLATELET - Abnormal; Notable for the following components:      Result Value   RBC 6.24 (*)    MCH 25.2 (*)    RDW 16.5 (*)    All other components within normal limits  BASIC METABOLIC PANEL - Abnormal; Notable for the following components:   Potassium 3.4 (*)    Glucose, Bld 170 (*)    Creatinine, Ser 1.47 (*)    Calcium 8.8 (*)    GFR, Estimated 60 (*)    All other components within normal limits    EKG None  Radiology No results found.  Procedures .Laceration Repair  Date/Time: 01/03/2024 11:28 PM  Performed by: Zadie Rhine, MD Authorized by: Zadie Rhine, MD     {Document cardiac monitor, telemetry assessment procedure when appropriate:1}  Medications Ordered in ED Medications - No data to display  ED Course/ Medical Decision Making/ A&P   {   Click here for ABCD2, HEART and  other calculatorsREFRESH Note before signing :1}                              Medical Decision Making Amount and/or Complexity of Data Reviewed Labs: ordered. Radiology: ordered.   This patient presents to the ED for concern of head trauma, this involves an extensive number of treatment options, and is a complaint that carries with it a high risk of complications and morbidity.  The differential diagnosis includes but is not limited to subarachnoid hemorrhage, intracranial hemorrhage, meningitis, encephalitis, CVST, temporal arteritis, idiopathic  intracranial hypertension, migraine    Comorbidities that complicate the patient evaluation: Patient's presentation is complicated by their history of hypertension and DOAC use  Social Determinants of Health: Patient's  alcohol use   increases the complexity of managing their presentation  Additional history obtained: Records reviewed  urgent care records reviewed  Lab Tests: I Ordered, and personally interpreted labs.  The pertinent results include: Renal insufficiency  Imaging Studies ordered: I ordered imaging studies including CT scan head/C-spine/maxillofacial   I independently visualized and interpreted imaging which showed *** I agree with the radiologist interpretation   Medicines ordered and prescription drug management: I ordered medication including Vicodin for pain Reevaluation of the patient after these medicines showed that the patient    {resolved/improved/worsened:23923::"improved"}  Test Considered: Patient is low risk / negative by ***, therefore do not feel that *** is indicated.  Critical Interventions:  ***  Consultations Obtained: I requested consultation with the {consultation:26851}, and discussed  findings as well as pertinent plan - they recommend: ***  Reevaluation: After the interventions noted above, I reevaluated the patient and found that they have :{resolved/improved/worsened:23923::"improved"}  Complexity of problems addressed: Patient's presentation is most consistent with  {ZOXW:96045}  Disposition: After consideration of the diagnostic results and the patient's response to treatment,  I feel that the patent would benefit from {disposition:26850}.     {Document critical care time when appropriate:1} {Document review of labs and clinical decision tools ie heart score, Chads2Vasc2 etc:1}  {Document your independent review of radiology images, and any outside records:1} {Document your discussion with family members, caretakers, and  with consultants:1} {Document social determinants of health affecting pt's care:1} {Document your decision making why or why not admission, treatments were needed:1} Final Clinical Impression(s) / ED Diagnoses Final diagnoses:  None    Rx / DC Orders ED Discharge Orders     None

## 2024-01-03 NOTE — ED Triage Notes (Signed)
 Pt arrives with c/o facial laceration and injury after falling. Pt was sitting in a chair leaned over and fell out of the chair face first. Pt does takes xarleto. Pt laceration on left side of his face near his nose. Pt reports ETOH and edible use tonight. Pts GCS is 15. Bleeding is controlled at this time.
